# Patient Record
Sex: Female | Born: 2000
Health system: Southern US, Community
[De-identification: ages and names within clinical notes are randomized; demographics above are authoritative.]

## PROBLEM LIST (undated history)

## (undated) DIAGNOSIS — N63 Unspecified lump in unspecified breast: Secondary | ICD-10-CM

---
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
## 2019-06-13 IMAGING — US ULTRASOUND LEFT BREAST LIMITED
1 series · 5 of 5 positions shown · non-contrast
Comparison: Previous exam(s).

CLINICAL DATA: Two year follow-up of a left breast mass

EXAM:
ULTRASOUND OF THE LEFT BREAST

[Series 1: ultrasound left breast limited · 0.06mm/px · 5 of 5 slices shown]
[im 1/5]
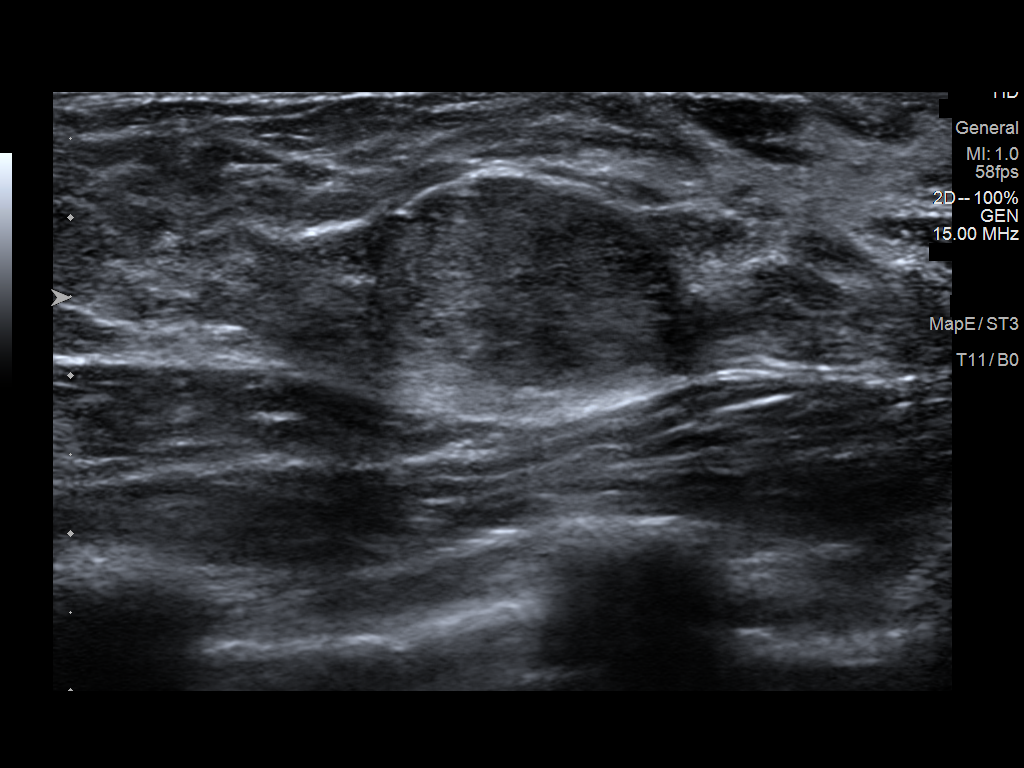
[im 2/5]
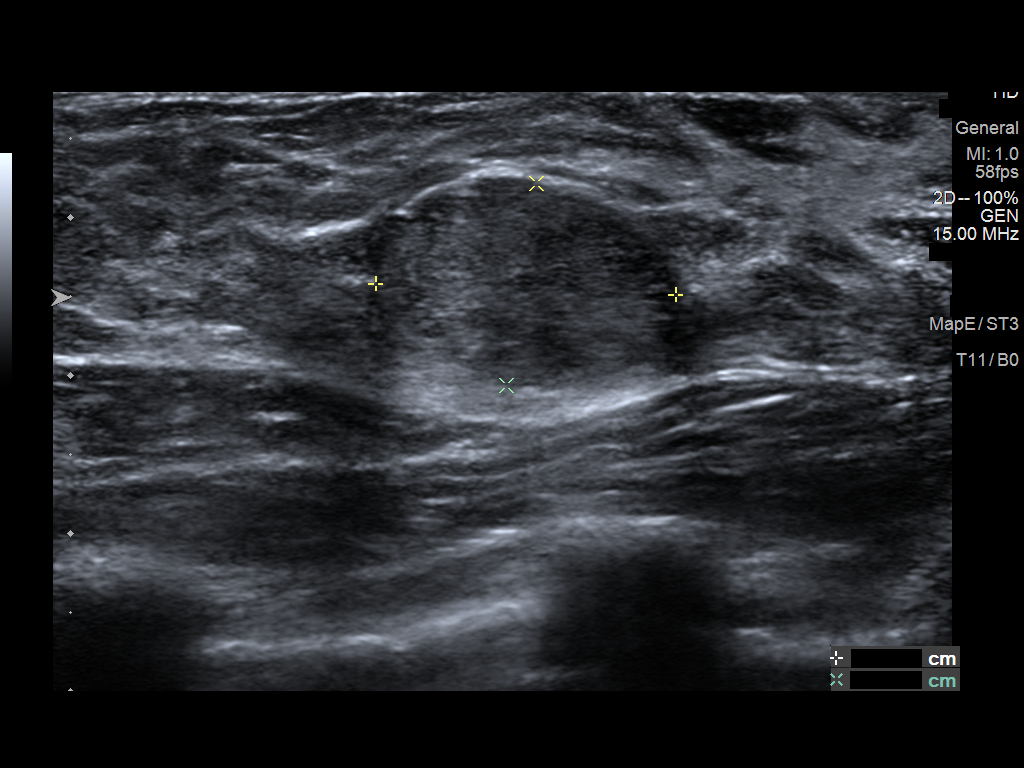
[im 3/5]
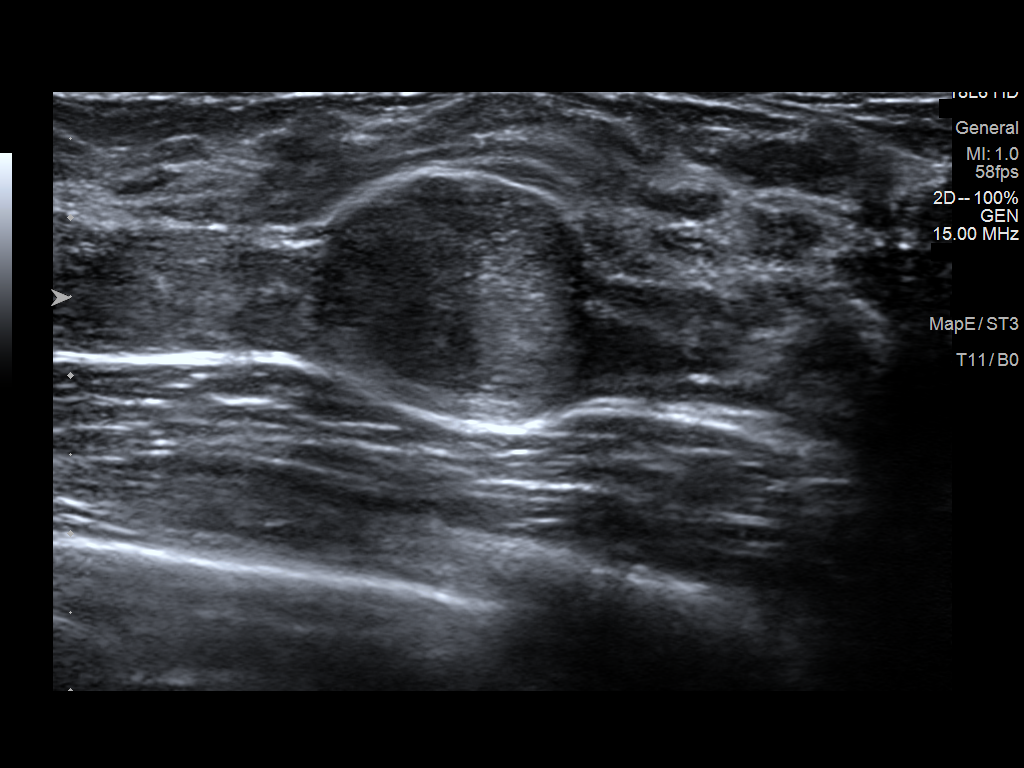
[im 4/5]
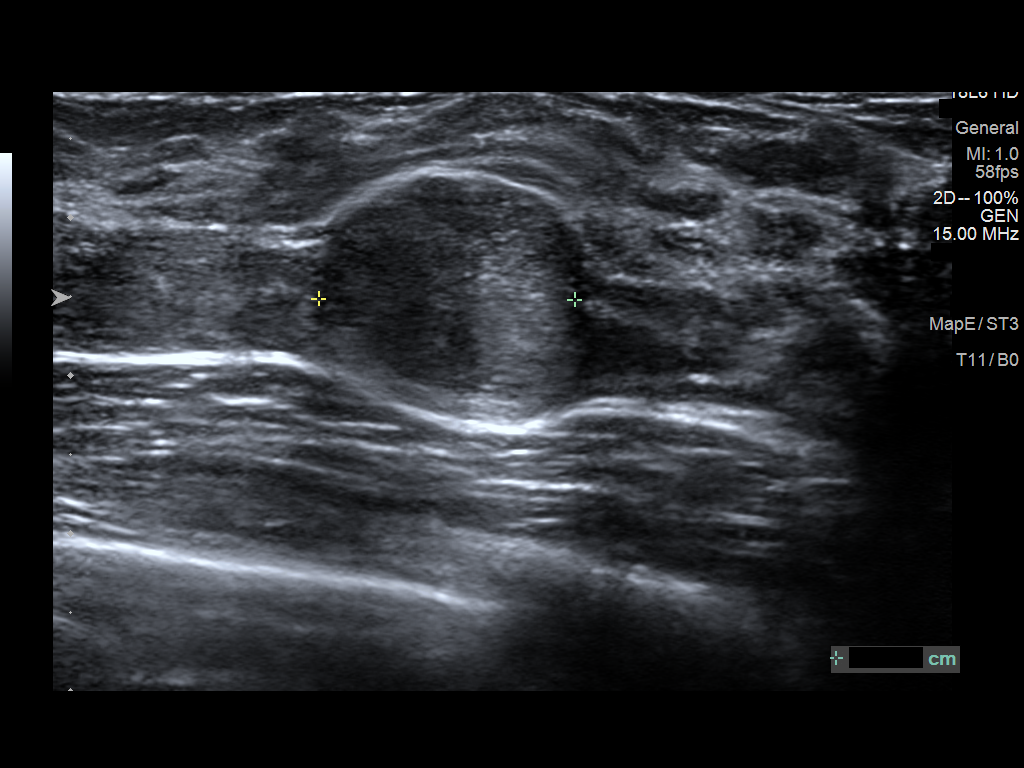
[im 5/5]
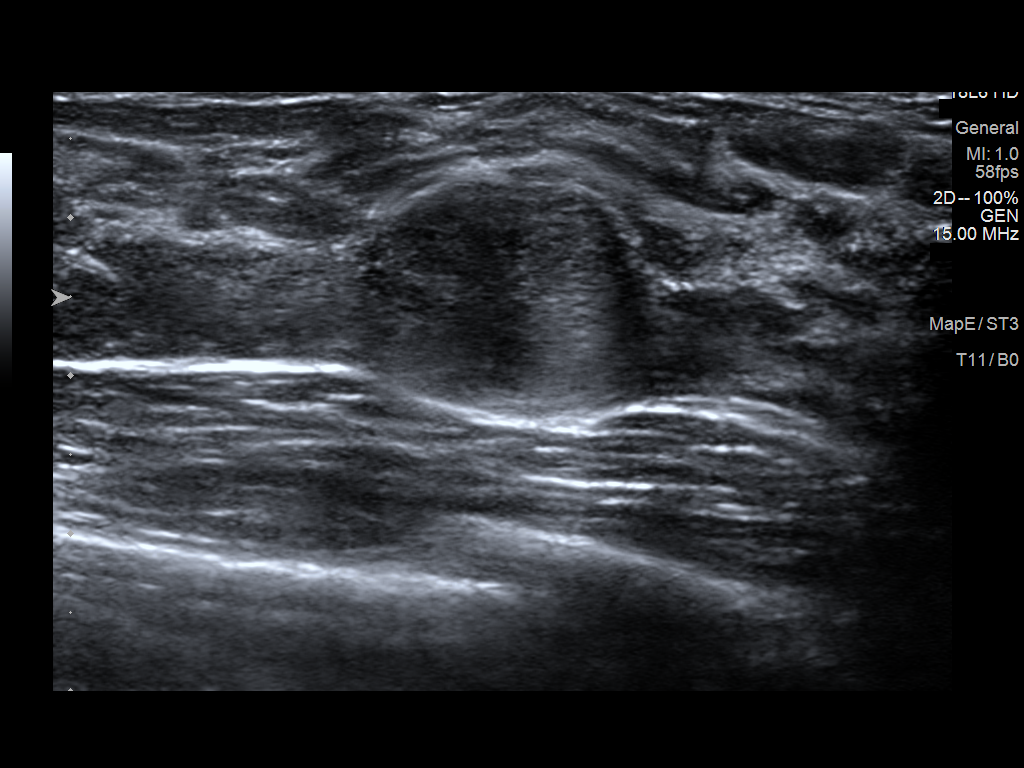

[5 of 5 positions shown; findings below may reference images not displayed]

FINDINGS: On physical exam, no suspicious lumps are identified.

Targeted ultrasound is performed, showing a mass in the left breast
at 11 o'clock, 3 cm from the nipple measuring 19 x 13 x 16 mm today
versus 19 x 13 x 15 mm in November 2016. No significant interval
change.
IMPRESSION: Stable left breast mass. Two years of stability confirms the mass as
benign. No further follow-up necessary.

RECOMMENDATION:
Recommend annual screening mammography beginning at the age of 40.

I have discussed the findings and recommendations with the patient.
Results were also provided in writing at the conclusion of the
visit. If applicable, a reminder letter will be sent to the patient
regarding the next appointment.

BI-RADS CATEGORY  2: Benign.

## 2019-12-24 ENCOUNTER — Ambulatory Visit: Payer: No Typology Code available for payment source | Admitting: Family Medicine

## 2020-01-12 ENCOUNTER — Encounter: Payer: Self-pay | Admitting: Family Medicine

## 2020-01-12 ENCOUNTER — Ambulatory Visit (INDEPENDENT_AMBULATORY_CARE_PROVIDER_SITE_OTHER): Payer: No Typology Code available for payment source | Admitting: Family Medicine

## 2020-01-12 ENCOUNTER — Other Ambulatory Visit: Payer: Self-pay

## 2020-01-12 VITALS — BP 125/77 | HR 67 | Temp 98.5°F | Resp 16 | Ht 64.75 in | Wt 127.2 lb

## 2020-01-12 DIAGNOSIS — Z30011 Encounter for initial prescription of contraceptive pills: Secondary | ICD-10-CM | POA: Diagnosis not present

## 2020-01-12 DIAGNOSIS — Z3046 Encounter for surveillance of implantable subdermal contraceptive: Secondary | ICD-10-CM

## 2020-01-12 MED ORDER — NORGESTIM-ETH ESTRAD TRIPHASIC 0.18/0.215/0.25 MG-35 MCG PO TABS: 1.0000 | ORAL_TABLET | Freq: Every day | ORAL | 11 refills | Status: DC

## 2020-01-12 NOTE — Progress Notes (Signed)
Established Patient Office Visit  Subjective:  Patient ID: Deborah Barnett, female    DOB: [birth year]  Age: 19 y.o. MRN: [id number]  CC:  Chief Complaint  Patient presents with  . nexplanon removal    HPI [name] presents for nexplanon removal of left arm  Past Medical History:  Diagnosis Date  . Breast mass    LEFT BREAST MASS @ 12:00 FOUND BY PT X 2 WEEKS    No past surgical history on file.  Family History  Problem Relation Age of Onset  . Breast cancer Paternal Grandmother   . Cancer Neg Hx     Social History   Socioeconomic History  . Marital status: Single    Spouse name: Not on file  . Number of children: Not on file  . Years of education: Not on file  . Highest education level: Not on file  Occupational History  . Not on file  Tobacco Use  . Smoking status: Never Smoker  . Smokeless tobacco: Never Used  Substance and Sexual Activity  . Alcohol use: Never  . Drug use: Never  . Sexual activity: Not on file  Other Topics Concern  . Not on file  Social History Narrative  . Not on file   Social Determinants of Health   Financial Resource Strain:   . Difficulty of Paying Living Expenses:   Food Insecurity:   . Worried About Charity fundraiser in the Last Year:   . Arboriculturist in the Last Year:   Transportation Needs:   . Film/video editor (Medical):   Marland Kitchen Lack of Transportation (Non-Medical):   Physical Activity:   . Days of Exercise per Week:   . Minutes of Exercise per Session:   Stress:   . Feeling of Stress :   Social Connections:   . Frequency of Communication with Friends and Family:   . Frequency of Social Gatherings with Friends and Family:   . Attends Religious Services:   . Active Member of Clubs or Organizations:   . Attends Archivist Meetings:   Marland Kitchen Marital Status:   Intimate Partner Violence:   . Fear of Current or Ex-Partner:   . Emotionally Abused:   Marland Kitchen Physically Abused:   . Sexually Abused:      Outpatient Medications Prior to Visit  Medication Sig Dispense Refill  . hydrocortisone 2.5 % ointment Apply topically 2 (two) times daily. 20 g 1  . nitrofurantoin (MACRODANTIN) 100 MG capsule Take 1 capsule (100 mg total) by mouth 4 (four) times daily. (Patient not taking: Reported on 01/12/2020) 14 capsule 0  . ciprofloxacin (CIPRO) 250 MG tablet Take 1 tablet (250 mg total) by mouth 2 (two) times daily. (Patient not taking: Reported on 01/12/2020) 6 tablet 0  . oseltamivir (TAMIFLU) 6 MG/ML SUSR suspension Take 12.5 mLs (75 mg total) by mouth 2 (two) times daily. (Patient not taking: Reported on 01/12/2020) 125 mL 0   Facility-Administered Medications Prior to Visit  Medication Dose Route Frequency Provider Last Rate Last Admin  . lidocaine-EPINEPHrine (XYLOCAINE W/EPI) 2 %-1:100000 (with pres) injection 4 mL  4 mL Intradermal Once Nolon Rod, Taron Conrey A, MD        No Known Allergies  ROS Review of Systems    Objective:    Physical Exam  BP 125/77 (BP Location: Right Arm, Patient Position: Sitting, Cuff Size: Normal)   Pulse 67   Temp 98.5 F (36.9 C) (Temporal)   Resp 16   Ht  5' 4.75" (1.645 m)   Wt 127 lb 3.2 oz (57.7 kg)   LMP 01/12/2020   SpO2 98%   BMI 21.33 kg/m  Wt Readings from Last 3 Encounters:  01/12/20 127 lb 3.2 oz (57.7 kg) (50 %, Z= -0.01)*  09/04/18 126 lb 6.4 oz (57.3 kg) (55 %, Z= 0.11)*  06/08/18 128 lb 3.2 oz (58.2 kg) (59 %, Z= 0.22)*   * Growth percentiles are based on CDC (Girls, 2-20 Years) data.   nexplanon palpated  Skin intact  Health Maintenance Due  Topic Date Due  . HIV Screening  Never done  . TETANUS/TDAP  Never done    There are no preventive care reminders to display for this patient.  No results found for: TSH Lab Results  Component Value Date   WBC 5.7 11/18/2018   HGB 11.5 (L) 11/18/2018   HCT 38.5 11/18/2018   MCV 85.6 11/18/2018   PLT 182 11/18/2018   Lab Results  Component Value Date   NA 139 11/18/2018   K 4.3  11/18/2018   CO2 23 11/18/2018   GLUCOSE 98 11/18/2018   BUN 12 11/18/2018   CREATININE 0.76 11/18/2018   CALCIUM 9.2 11/18/2018   ANIONGAP 8 11/18/2018   No results found for: CHOL No results found for: HDL No results found for: LDLCALC No results found for: TRIG No results found for: CHOLHDL No results found for: HGBA1C    Assessment & Plan:   Problem List Items Addressed This Visit    None    Visit Diagnoses    Encounter for initial prescription of contraceptive pills    -  Primary   Relevant Medications   Norgestimate-Ethinyl Estradiol Triphasic (TRI-SPRINTEC) 0.18/0.215/0.25 MG-35 MCG tablet   Nexplanon removal          Meds ordered this encounter  Medications  . Norgestimate-Ethinyl Estradiol Triphasic (TRI-SPRINTEC) 0.18/0.215/0.25 MG-35 MCG tablet    Sig: Take 1 tablet by mouth daily.    Dispense:  1 Package    Refill:  11    Follow-up: Return if symptoms worsen or fail to improve.    Forrest Moron, MD

## 2020-01-12 NOTE — Patient Instructions (Addendum)
If you have lab work done today you will be contacted with your lab results within the next 2 weeks.  If you have not heard from [place] then please contact us. The fastest way to get your results is to register for My Chart.   IF you received an x-ray today, you will receive an invoice from [facility]. Please contact [facility] at [phone] with questions or concerns regarding your invoice.   IF you received labwork today, you will receive an invoice from [place]. Please contact [facility] at [phone] with questions or concerns regarding your invoice.   Our billing staff will not be able to assist you with questions regarding bills from these companies.  You will be contacted with the lab results as soon as they are available. The fastest way to get your results is to activate your My Chart account. Instructions are located on the last page of this paperwork. If you have not heard from [place] regarding the results in 2 weeks, please contact this office.     Oral Contraception Use Oral contraceptive pills (OCPs) are medicines that you take to prevent pregnancy. OCPs work by:  Preventing the ovaries from releasing eggs.  Thickening mucus in the lower part of the uterus (cervix), which prevents sperm from entering the uterus.  Thinning the lining of the uterus (endometrium), which prevents a fertilized egg from attaching to the endometrium. OCPs are highly effective when taken exactly as prescribed. However, OCPs do not prevent sexually transmitted infections (STIs). Safe sex practices, such as using condoms while on an OCP, can help prevent STIs. Before taking OCPs, you may have a physical exam, blood test, and Pap test. A Pap test involves taking a sample of cells from your cervix to check for cancer. Discuss with your health care provider the possible side effects of the OCP you may be prescribed. When you start an OCP, be aware that it can take 2-3 months for your  body to adjust to changes in hormone levels. How to take oral contraceptive pills Follow instructions from your health care provider about how to start taking your first cycle of OCPs. Your health care provider may recommend that you:  Start the pill on day 1 of your menstrual period. If you start at this time, you will not need any backup form of birth control (contraception), such as condoms.  Start the pill on the first Sunday after your menstrual period or on the day you get your prescription. In these cases, you will need to use backup contraception for the first week.  Start the pill at any time of your cycle. ? If you take the pill within 5 days of the start of your period, you will not need a backup form of contraception. ? If you start at any other time of your menstrual cycle, you will need to use another form of contraception for 7 days. If your OCP is the type called a minipill, it will protect you from pregnancy after taking it for 2 days (48 hours), and you can stop using backup contraception after that time. After you have started taking OCPs:  If you forget to take 1 pill, take it as soon as you remember. Take the next pill at the regular time.  If you miss 2 or more pills, call your health care provider. Different pills have different instructions for missed doses. Use backup birth control until your next menstrual period starts.  If you use a 28-day  pack that contains inactive pills and you miss 1 of the last 7 pills (pills with no hormones), throw away the rest of the non-hormone pills and start a new pill pack. No matter which day you start the OCP, you will always start a new pack on that same day of the week. Have an extra pack of OCPs and a backup contraceptive method available in case you miss some pills or lose your OCP pack. Follow these instructions at home:  Do not use any products that contain nicotine or tobacco, such as cigarettes and e-cigarettes. If you need help  quitting, ask your health care provider.  Always use a condom to protect against STIs. OCPs do not protect against STIs.  Use a calendar to mark the days of your menstrual period.  Read the information and directions that came with your OCP. Talk to your health care provider if you have questions. Contact a health care provider if:  You develop nausea and vomiting.  You have abnormal vaginal discharge or bleeding.  You develop a rash.  You miss your menstrual period. Depending on the type of OCP you are taking, this may be a sign of pregnancy. Ask your health care provider for more information.  You are losing your hair.  You need treatment for mood swings or depression.  You get dizzy when taking the OCP.  You develop acne after taking the OCP.  You become pregnant or think you may be pregnant.  You have diarrhea, constipation, and abdominal pain or cramps.  You miss 2 or more pills. Get help right away if:  You develop chest pain.  You develop shortness of breath.  You have an uncontrolled or severe headache.  You develop numbness or slurred speech.  You develop visual or speech problems.  You develop pain, redness, and swelling in your legs.  You develop weakness or numbness in your arms or legs. Summary  Oral contraceptive pills (OCPs) are medicines that you take to prevent pregnancy.  OCPs do not prevent sexually transmitted infections (STIs). Always use a condom to protect against STIs.  When you start an OCP, be aware that it can take 2-3 months for your body to adjust to changes in hormone levels.  Read all the information and directions that come with your OCP. This information is not intended to replace advice given to you by your health care provider. Make sure you discuss any questions you have with your health care provider. Document Revised: 01/24/2019 Document Reviewed: 11/13/2016 Elsevier Patient Education  [place].

## 2020-02-08 NOTE — Progress Notes (Signed)
Nexplanon removal Procedure Note of LEFT ARM Nexplanon Implant Removal Procedure Note  After discussion of contraception options, the patient wishes to proceed with removal of the Nexplanon device. Written informed consent was obtained and the procedure was reviewed.  Prior to the procedure being performed, a "time out" was performed that confirmed the correct patient, procedure and site.  The implant capsule was located and the site was cleaned with alcohol. With injection of 1-2 ml of 1% lidocaine with EPI, local anesthesia was achieved. The area was prepped with Betadine. Using sterile technique, a stab incision with the scalpel was made, the implant capsule was grasped with a hemostat and removed. The provider and patient verified that the capsule was removed intact. Pressure was applied to the area and steri-strips were used to close the skin opening.  A pressure dressing was applied.   The patient tolerated the procedure well, with minimal blood loss and no complications.  She was instructed to remove the pressure bandage this evening, the band aid after 24 hours, and the steri-strips in 5-7 days. She was instructed to call for increasing pain, fever, redness, warmth, pus, or any concern for infection.  She was advised to take acetaminophen and/or ibuprofen as needed for pain relief.

## 2020-07-02 ENCOUNTER — Other Ambulatory Visit: Payer: No Typology Code available for payment source

## 2020-07-06 ENCOUNTER — Other Ambulatory Visit: Payer: No Typology Code available for payment source

## 2020-07-06 DIAGNOSIS — Z20822 Contact with and (suspected) exposure to covid-19: Secondary | ICD-10-CM

## 2020-07-08 LAB — NOVEL CORONAVIRUS, NAA: SARS-CoV-2, NAA: DETECTED — AB

## 2020-07-08 LAB — SARS-COV-2, NAA 2 DAY TAT

## 2020-07-09 ENCOUNTER — Telehealth (HOSPITAL_COMMUNITY): Payer: Self-pay

## 2020-07-09 NOTE — Telephone Encounter (Signed)
RN reached out to patient to see if she was interested in receiving monoclonal antibodies for COVID-19. After speaking to patient, she stated she is feeling much better and declined treatment at this time.

## 2021-06-06 ENCOUNTER — Other Ambulatory Visit: Payer: Self-pay

## 2021-06-06 ENCOUNTER — Encounter: Payer: Self-pay | Admitting: Registered Nurse

## 2021-06-06 ENCOUNTER — Ambulatory Visit (INDEPENDENT_AMBULATORY_CARE_PROVIDER_SITE_OTHER): Payer: 59 | Admitting: Registered Nurse

## 2021-06-06 VITALS — BP 120/72 | HR 71 | Temp 98.3°F | Ht 64.75 in | Wt 135.2 lb

## 2021-06-06 DIAGNOSIS — N912 Amenorrhea, unspecified: Secondary | ICD-10-CM | POA: Diagnosis not present

## 2021-06-06 LAB — POCT URINE PREGNANCY: Preg Test, Ur: POSITIVE — AB

## 2021-06-06 NOTE — Patient Instructions (Signed)
Ms. Dilday -  Your pregnancy test returned positive -   There are obviously options to consider as we had discussed  For now, start a daily vitamin with iron. Take this with food  Nausea should improve in coming weeks.  Let me know if you maintain - can refer to OBGYN group  If you decide to terminate, I highly recommend [facility] [address]. Let me know if you have questions or concerns about this - as discussed, I had worked at [employer] in [place] for a few years and would be happy to dispell myths, give facts, advocate, etc.  See you in a year, sooner with concerns.  Thank you  Rich

## 2021-06-06 NOTE — Progress Notes (Signed)
New Patient Office Visit  Subjective:  Patient ID: Deborah Barnett, female    DOB: 06/21/2001  Age: 20 y.o. MRN: [id number]  CC:  Chief Complaint  Patient presents with   Establish Care   Menstrual Problem    Pt says that she has not had a cycle since IUD removal.     HPI Deborah Barnett presents for visit to est care  Histories reviewed and updated with patient.   Notes missed menses - LMP early July 2022  Nexplanon - removed by Dr. Delia Chimes on 01/12/20. Started on COCs at that time. Given 1 pack with 11 refills. Notes she has stopped taking these as of 1-2 mo after initial rx No menses since nexplanon removal. No spotting or cramping.  She has been sexually active since last menses  She does note nausea, particularly in mornings. Sensitive to smells. No vomiting  Denies hirsutism   Has seen weight gain of 8lb since last OV with [facility] - from March 2021 through today. Not typical for her. Has been the same weight "since always"  Did have some constipation recently when trying to start a vegetarian diet, added meat back into her diet and didn't see improvement. Still dealing with this.   Does note a lot of fatigue recently. Feels sleeping has become less restful. No witnessed apnea, headaches on waking, daytime somnolence.  No fam hx of autoimmune processes/ thyroid disease. No personal hx of thyroid disease, anemia, or otherwise  Past Medical History:  Diagnosis Date   Breast mass    LEFT BREAST MASS @ 12:00 FOUND BY PT X 2 WEEKS    History reviewed. No pertinent surgical history.  Family History  Problem Relation Age of Onset   Breast cancer Paternal Grandmother    Cancer Neg Hx     Social History   Socioeconomic History   Marital status: Single    Spouse name: Not on file   Number of children: Not on file   Years of education: Not on file   Highest education level: Not on file  Occupational History   Not on file  Tobacco Use   Smoking status: Never    Smokeless tobacco: Never  Substance and Sexual Activity   Alcohol use: Never   Drug use: Never   Sexual activity: Not on file  Other Topics Concern   Not on file  Social History Narrative   Not on file   Social Determinants of Health   Financial Resource Strain: Not on file  Food Insecurity: Not on file  Transportation Needs: Not on file  Physical Activity: Not on file  Stress: Not on file  Social Connections: Not on file  Intimate Partner Violence: Not on file    ROS Review of Systems  Constitutional: Negative.   HENT: Negative.    Eyes: Negative.   Respiratory: Negative.    Cardiovascular: Negative.   Gastrointestinal: Negative.   Genitourinary: Negative.        Amenorrhea  Musculoskeletal: Negative.   Skin: Negative.   Neurological: Negative.   Psychiatric/Behavioral: Negative.    All other systems reviewed and are negative.  Objective:   Today's Vitals: BP 120/72   Pulse 71   Temp 98.3 F (36.8 C) (Temporal)   Ht 5' 4.75" (1.645 m)   Wt 135 lb 3.2 oz (61.3 kg)   LMP  (LMP Unknown)   SpO2 100%   BMI 22.67 kg/m   Physical Exam Vitals and nursing note reviewed.  Constitutional:  General: She is not in acute distress.    Appearance: Normal appearance. She is not ill-appearing, toxic-appearing or diaphoretic.  Cardiovascular:     Rate and Rhythm: Normal rate and regular rhythm.     Pulses: Normal pulses.     Heart sounds: Normal heart sounds. No murmur heard.   No friction rub. No gallop.  Pulmonary:     Effort: Pulmonary effort is normal. No respiratory distress.     Breath sounds: Normal breath sounds. No stridor. No wheezing, rhonchi or rales.  Chest:     Chest wall: No tenderness.  Skin:    General: Skin is warm and dry.     Capillary Refill: Capillary refill takes less than 2 seconds.  Neurological:     General: No focal deficit present.     Mental Status: She is alert and oriented to person, place, and time. Mental status is at baseline.   Psychiatric:        Mood and Affect: Mood normal.        Behavior: Behavior normal.        Thought Content: Thought content normal.        Judgment: Judgment normal.    Assessment & Plan:   Problem List Items Addressed This Visit   None   Outpatient Encounter Medications as of 06/06/2021  Medication Sig   hydrocortisone 2.5 % ointment Apply topically 2 (two) times daily. (Patient not taking: Reported on 06/06/2021)   nitrofurantoin (MACRODANTIN) 100 MG capsule Take 1 capsule (100 mg total) by mouth 4 (four) times daily. (Patient not taking: Reported on 01/12/2020)   Norgestimate-Ethinyl Estradiol Triphasic (TRI-SPRINTEC) 0.18/0.215/0.25 MG-35 MCG tablet Take 1 tablet by mouth daily.   Facility-Administered Encounter Medications as of 06/06/2021  Medication   lidocaine-EPINEPHrine (XYLOCAINE W/EPI) 2 %-1:100000 (with pres) injection 4 mL    Follow-up: No follow-ups on file.   PLAN POCT urine preg test shows positive - est gest age of 6-8 weeks based on last menses, though she is unsure of LMP Reviewed options for pregnancy - maintain vs. Terminate, discussed each and answered questions from pt. She will discuss with partner, family, and support before making a decision. Patient encouraged to call clinic with any questions, comments, or concerns.  Maximiano Coss, NP

## 2021-07-08 ENCOUNTER — Ambulatory Visit: Payer: 59 | Admitting: Registered Nurse

## 2021-08-15 ENCOUNTER — Ambulatory Visit: Payer: 59 | Admitting: Registered Nurse

## 2021-08-15 ENCOUNTER — Encounter: Payer: Self-pay | Admitting: Registered Nurse

## 2021-08-15 ENCOUNTER — Other Ambulatory Visit: Payer: Self-pay

## 2021-08-15 VITALS — BP 109/62 | HR 75 | Temp 98.1°F | Resp 18 | Ht 64.0 in | Wt 132.4 lb

## 2021-08-15 DIAGNOSIS — R1011 Right upper quadrant pain: Secondary | ICD-10-CM | POA: Diagnosis not present

## 2021-08-15 DIAGNOSIS — R109 Unspecified abdominal pain: Secondary | ICD-10-CM

## 2021-08-15 DIAGNOSIS — Z3009 Encounter for other general counseling and advice on contraception: Secondary | ICD-10-CM | POA: Diagnosis not present

## 2021-08-15 LAB — POCT URINALYSIS DIP (MANUAL ENTRY)
Bilirubin, UA: NEGATIVE
Blood, UA: NEGATIVE
Glucose, UA: NEGATIVE mg/dL
Ketones, POC UA: NEGATIVE mg/dL
Nitrite, UA: NEGATIVE
Protein Ur, POC: 30 mg/dL — AB
Spec Grav, UA: >=1.03 — AB (ref 1.010–1.025)
Urobilinogen, UA: 0.2 E.U./dL
pH, UA: 5 (ref 5.0–8.0)

## 2021-08-15 NOTE — Progress Notes (Signed)
Established Patient Office Visit  Subjective:  Patient ID: Deborah Barnett, female    DOB: 08/21/2001  Age: 20 y.o. MRN: [id number]  CC:  Chief Complaint  Patient presents with   Knee Pain    Patient states she was having some right side pain for about 2 weeks. Patient wants to discuss birth control.    HPI Deborah Barnett presents for ruq  RUQ pain: Ongoing 2 weeks. Waxing and waning. Resolved in past 1-2 days. Originally thought she had pulled a muscle, but pain was different. Describes as stabbing pain.  Denies nausea, vomiting, diarrhea, constipation. No recent weight changes No fevers, chills, fatigue. No food intolerances that she is aware  Did have some shoulder pain at the same time.  BCM: Interested in nexplanon Has been on in the past, did have some breakthrough bleeding Would like to try again.  Otherwise no acute concerns.    Past Medical History:  Diagnosis Date   Breast mass    LEFT BREAST MASS @ 12:00 FOUND BY PT X 2 WEEKS    History reviewed. No pertinent surgical history.  Family History  Problem Relation Age of Onset   Breast cancer Paternal Grandmother    Cancer Neg Hx     Social History   Socioeconomic History   Marital status: Single    Spouse name: Not on file   Number of children: 0   Years of education: Not on file   Highest education level: Not on file  Occupational History   Not on file  Tobacco Use   Smoking status: Never   Smokeless tobacco: Never  Vaping Use   Vaping Use: Never used  Substance and Sexual Activity   Alcohol use: Never   Drug use: Never   Sexual activity: Not on file  Other Topics Concern   Not on file  Social History Narrative   Not on file   Social Determinants of Health   Financial Resource Strain: Not on file  Food Insecurity: Not on file  Transportation Needs: Not on file  Physical Activity: Not on file  Stress: Not on file  Social Connections: Not on file  Intimate Partner Violence: Not on file     Outpatient Medications Prior to Visit  Medication Sig Dispense Refill   hydrocortisone 2.5 % ointment Apply topically 2 (two) times daily. (Patient not taking: No sig reported) 20 g 1   Facility-Administered Medications Prior to Visit  Medication Dose Route Frequency Provider Last Rate Last Admin   lidocaine-EPINEPHrine (XYLOCAINE W/EPI) 2 %-1:100000 (with pres) injection 4 mL  4 mL Intradermal Once Nolon Rod, Zoe A, MD        No Known Allergies  ROS Review of Systems  Constitutional: Negative.   HENT: Negative.    Eyes: Negative.   Respiratory: Negative.    Cardiovascular: Negative.   Gastrointestinal: Negative.   Genitourinary: Negative.   Musculoskeletal: Negative.   Skin: Negative.   Neurological: Negative.   Psychiatric/Behavioral: Negative.    All other systems reviewed and are negative.    Objective:    Physical Exam Vitals and nursing note reviewed.  Constitutional:      General: She is not in acute distress.    Appearance: Normal appearance. She is normal weight. She is not ill-appearing, toxic-appearing or diaphoretic.  Cardiovascular:     Rate and Rhythm: Normal rate and regular rhythm.     Heart sounds: Normal heart sounds. No murmur heard.   No friction rub. No gallop.  Pulmonary:  Effort: Pulmonary effort is normal. No respiratory distress.     Breath sounds: Normal breath sounds. No stridor. No wheezing, rhonchi or rales.  Chest:     Chest wall: No tenderness.  Skin:    General: Skin is warm and dry.  Neurological:     General: No focal deficit present.     Mental Status: She is alert and oriented to person, place, and time. Mental status is at baseline.  Psychiatric:        Mood and Affect: Mood normal.        Behavior: Behavior normal.        Thought Content: Thought content normal.        Judgment: Judgment normal.    BP 109/62   Pulse 75   Temp 98.1 F (36.7 C) (Temporal)   Resp 18   Ht 5\' 4"  (1.626 m)   Wt 132 lb 6.4 oz (60.1  kg)   SpO2 99%   BMI 22.73 kg/m  Wt Readings from Last 3 Encounters:  08/15/21 132 lb 6.4 oz (60.1 kg)  06/06/21 135 lb 3.2 oz (61.3 kg)  01/12/20 127 lb 3.2 oz (57.7 kg) (50 %, Z= -0.01)*   * Growth percentiles are based on CDC (Girls, 2-20 Years) data.     Health Maintenance Due  Topic Date Due   HPV VACCINES (1 - 2-dose series) Never done   HIV Screening  Never done   Hepatitis C Screening  Never done   TETANUS/TDAP  Never done   INFLUENZA VACCINE  05/16/2021   PAP-Cervical Cytology Screening  07/31/2021   PAP SMEAR-Modifier  07/31/2021       Topic Date Due   HPV VACCINES (1 - 2-dose series) Never done    No results found for: TSH Lab Results  Component Value Date   WBC 5.7 11/18/2018   HGB 11.5 (L) 11/18/2018   HCT 38.5 11/18/2018   MCV 85.6 11/18/2018   PLT 182 11/18/2018   Lab Results  Component Value Date   NA 139 11/18/2018   K 4.3 11/18/2018   CO2 23 11/18/2018   GLUCOSE 98 11/18/2018   BUN 12 11/18/2018   CREATININE 0.76 11/18/2018   CALCIUM 9.2 11/18/2018   ANIONGAP 8 11/18/2018   No results found for: CHOL No results found for: HDL No results found for: LDLCALC No results found for: TRIG No results found for: CHOLHDL No results found for: HGBA1C    Assessment & Plan:   Problem List Items Addressed This Visit   None Visit Diagnoses     Flank pain    -  Primary   Relevant Orders   POCT urinalysis dipstick (Completed)   Encounter for counseling regarding contraception       RUQ pain           No orders of the defined types were placed in this encounter.   Follow-up: Return if symptoms worsen or fail to improve.   PLAN Plan to insert nexplanon within first 5 days of menses. Will run through insurance ahead of time. Reviewed risks, benefits, AE to nexplanon. Pt voices understanding Watch and wait on pain - will pursue RUQ [place] if recurrent. Patient encouraged to call clinic with any questions, comments, or concerns.  Maximiano Coss, NP

## 2021-08-15 NOTE — Patient Instructions (Addendum)
Ms. Strollo -   Doristine Devoid to see you. In brief:  Could be a couple of things for R side pain. If it recurs, let me know. Can pursue imaging (ultrasound) if it does. No need for another visit, I'd call in an order. Nexplanon - best time is first five days of your cycle, but can be inserted at any point. If not inserted in the first five days of your cycle, will need to use condoms or abstain if sexually active and intending to prevent pregnancy. I will have our office call you to let you know when we can get the Nexplanon in stock  Thank you!  Rich    If you have lab work done today you will be contacted with your lab results within the next 2 weeks.  If you have not heard from [place] then please contact us. The fastest way to get your results is to register for My Chart.   IF you received an x-ray today, you will receive an invoice from [facility]. Please contact [facility] at [phone] with questions or concerns regarding your invoice.   IF you received labwork today, you will receive an invoice from [place]. Please contact [facility] at [phone] with questions or concerns regarding your invoice.   Our billing staff will not be able to assist you with questions regarding bills from these companies.  You will be contacted with the lab results as soon as they are available. The fastest way to get your results is to activate your My Chart account. Instructions are located on the last page of this paperwork. If you have not heard from [place] regarding the results in 2 weeks, please contact this office.

## 2021-08-29 ENCOUNTER — Telehealth: Payer: Self-pay | Admitting: Registered Nurse

## 2021-08-29 NOTE — Telephone Encounter (Signed)
Are you able to look into this please?

## 2021-08-29 NOTE — Telephone Encounter (Signed)
..  Caller name:Irina Amalia Hailey  On DPR? :yes/no: Yes  Call back number:[phone]  Provider they see: Orland Mustard  Reason for call:   Patient needs to know if we have her nextline birthcontrol in office yet.

## 2021-08-29 NOTE — Telephone Encounter (Signed)
Unfortunately I have not heard. CCing Faby on this   Thanks,  Denice Paradise

## 2021-10-12 ENCOUNTER — Other Ambulatory Visit: Payer: Self-pay | Admitting: Registered Nurse

## 2021-10-12 ENCOUNTER — Encounter: Payer: Self-pay | Admitting: Registered Nurse

## 2021-10-12 DIAGNOSIS — Z3009 Encounter for other general counseling and advice on contraception: Secondary | ICD-10-CM

## 2021-10-12 NOTE — Telephone Encounter (Signed)
I see that there was some discussion about this last month. Has there been any follow up or new info?

## 2022-06-07 ENCOUNTER — Encounter: Payer: 59 | Admitting: Registered Nurse

## 2024-10-24 ENCOUNTER — Emergency Department (HOSPITAL_BASED_OUTPATIENT_CLINIC_OR_DEPARTMENT_OTHER)
Admission: EM | Admit: 2024-10-24 | Discharge: 2024-10-24 | Disposition: A | Discharge status: Home / Self Care | Payer: Self-pay | Attending: Emergency Medicine | Admitting: Emergency Medicine

## 2024-10-24 ENCOUNTER — Other Ambulatory Visit: Payer: Self-pay

## 2024-10-24 ENCOUNTER — Encounter (HOSPITAL_BASED_OUTPATIENT_CLINIC_OR_DEPARTMENT_OTHER): Payer: Self-pay

## 2024-10-24 DIAGNOSIS — H5789 Other specified disorders of eye and adnexa: Secondary | ICD-10-CM | POA: Insufficient documentation

## 2024-10-24 NOTE — ED Provider Notes (Addendum)
 " [place] [facility] DEPARTMENT AT [facility] Provider Note   CSN: [id number] Arrival date & time: 10/24/24  1010     Patient presents with: Eye Problem   Deborah Barnett is a 24 y.o. female.   24 year old female presents today for concern of left eye trouble that she has been having now since November.  She states she has had multiple visits with her PCP as well as a recent visit with optometry.  She was trialed on a recent course of steroids which she states she completed yesterday.  She has not had any improvements.  She states occasionally during the day and randomly she has swelling to the left eye with pressure.  She states that she notices that the eye does not fully close when she is blinking.  This occurs at random times including in the mornings.  She denies any injury.  No drainage, vision change.  She was recommended to follow-up with neurology by PCP as well as optometrist but has not received any referrals.  She states it has gone on too long and she would like to get this resolved today.  The history is provided by the patient. No language interpreter was used.       Prior to Admission medications  Medication Sig Start Date End Date Taking? Authorizing Provider  hydrocortisone  2.5 % ointment Apply topically 2 (two) times daily. Patient not taking: No sig reported 12/26/18   Stallings, Zoe A, MD    Allergies: Patient has no known allergies.    Review of Systems  Constitutional:  Negative for chills and fever.  Eyes:  Negative for photophobia, pain, discharge, redness, itching and visual disturbance.  All other systems reviewed and are negative.   Updated Vital Signs BP 119/73 (BP Location: Right Arm)   Pulse 69   Temp 98.4 F (36.9 C) (Oral)   Resp 12   SpO2 100%   Physical Exam Vitals and nursing note reviewed.  Eyes:     General: Lids are normal. Vision grossly intact. No scleral icterus.       Right eye: No discharge.        Left eye: No discharge.      Extraocular Movements: Extraocular movements intact.     Conjunctiva/sclera: Conjunctivae normal.     Pupils: Pupils are equal, round, and reactive to light.     Comments: No swelling identified.  Pupils equal round reactive to light.  Without nystagmus, conjunctival hemorrhage.  Neurological:     General: No focal deficit present.     Mental Status: She is oriented to person, place, and time. Mental status is at baseline.     Cranial Nerves: No cranial nerve deficit.     Motor: No weakness.     Comments: Cranial nerves III through XII are intact.  Good range of motion in bilateral upper and lower extremities with 5/5 strength.  No pronator drift.  Normal speech.  No facial droop.     (all labs ordered are listed, but only abnormal results are displayed) Labs Reviewed - No data to display  EKG: None  Radiology: No results found.   Procedures   Medications Ordered in the ED - No data to display                                  Medical Decision Making  24 year old female presents with above-mentioned complaints.  She is overall well-appearing.  Hemodynamically stable.  Without acute distress.  Vision is grossly intact.  Denies any visual change, eye pain.  She states however that throughout the day she would have episodes of lid swelling, inability to fully close her left eyelid.  She denies any paresthesias when this occurs.  No speech change or facial droop.  She does show me a picture with minimal lower lid swelling otherwise no obvious change.  On exam her cranial nerves III through XII are intact.  Pupils are equal round reactive to light.  EOMs are intact.  On exam when she has her eyes closed unable to open them signaling good strength.  Neurological exam is without any focal deficits.  Neurology referral given.  However this may be best addressed by ophthalmology.  Referral to ophthalmology given.  Patient discharged in stable condition.  However she is frustrated that  this issue cannot be resolved with medication that I can prescribe her today.  We discussed the reassuring exam against anything emergent.  Appropriate referrals have been placed.  Discussed importance of follow-up.  Final diagnoses:  Eye swelling, left    ED Discharge Orders          Ordered    Ambulatory referral to Neurology       Comments: An appointment is requested in approximately: 2 weeks   10/24/24 1120               Hildegard Loge, PA-C 10/24/24 1130    Hildegard Loge, PA-C 10/24/24 1130    Rogelia Jerilynn RAMAN, MD 10/24/24 1151  "

## 2024-10-24 NOTE — Discharge Instructions (Addendum)
 No concerning findings on your exam today.  I have given you a referral to neurology as well as ophthalmology.  Please call the ophthalmology office today.  Return for any emergent symptoms.

## 2024-10-24 NOTE — ED Notes (Signed)

## 2024-10-24 NOTE — ED Triage Notes (Signed)
 Patient arrives stating her left eyelid has not been able to close. First noticed in November 2025. She is able to close her eyes fully in triage. She states she notices it more in photos that its asymmetrical and feels like sometimes it looks like it is bulging. No neurological symptoms. No vision changes.
